# Patient Record
Sex: Male | Born: 1995 | Race: Asian | Hispanic: No | Marital: Single | Smoking: Never smoker
Health system: Southern US, Community
[De-identification: ages and names within clinical notes are randomized; demographics above are authoritative.]

## PROBLEM LIST (undated history)

## (undated) HISTORY — PX: SMALL INTESTINE SURGERY: SHX150

## (undated) HISTORY — PX: APPENDECTOMY: SHX54

---

## 2016-03-26 ENCOUNTER — Emergency Department (HOSPITAL_COMMUNITY): Payer: No Typology Code available for payment source

## 2016-03-26 ENCOUNTER — Encounter (HOSPITAL_COMMUNITY): Payer: Self-pay

## 2016-03-26 ENCOUNTER — Emergency Department (HOSPITAL_COMMUNITY)
Admission: EM | Admit: 2016-03-26 | Discharge: 2016-03-26 | Disposition: A | Discharge status: Home / Self Care | Payer: No Typology Code available for payment source | Attending: Emergency Medicine | Admitting: Emergency Medicine

## 2016-03-26 DIAGNOSIS — S93501A Unspecified sprain of right great toe, initial encounter: Secondary | ICD-10-CM | POA: Diagnosis not present

## 2016-03-26 DIAGNOSIS — Y939 Activity, unspecified: Secondary | ICD-10-CM | POA: Diagnosis not present

## 2016-03-26 DIAGNOSIS — Y92410 Unspecified street and highway as the place of occurrence of the external cause: Secondary | ICD-10-CM | POA: Insufficient documentation

## 2016-03-26 DIAGNOSIS — S99921A Unspecified injury of right foot, initial encounter: Secondary | ICD-10-CM | POA: Diagnosis present

## 2016-03-26 DIAGNOSIS — R109 Unspecified abdominal pain: Secondary | ICD-10-CM | POA: Insufficient documentation

## 2016-03-26 DIAGNOSIS — Y999 Unspecified external cause status: Secondary | ICD-10-CM | POA: Diagnosis not present

## 2016-03-26 DIAGNOSIS — S20212A Contusion of left front wall of thorax, initial encounter: Secondary | ICD-10-CM | POA: Diagnosis not present

## 2016-03-26 DIAGNOSIS — R079 Chest pain, unspecified: Secondary | ICD-10-CM | POA: Insufficient documentation

## 2016-03-26 DIAGNOSIS — S93509A Unspecified sprain of unspecified toe(s), initial encounter: Secondary | ICD-10-CM

## 2016-03-26 LAB — I-STAT CHEM 8, ED
BUN: 21 mg/dL — ABNORMAL HIGH (ref 6–20)
Calcium, Ion: 1.26 mmol/L (ref 1.15–1.40)
Chloride: 103 mmol/L (ref 101–111)
Creatinine, Ser: 0.8 mg/dL (ref 0.61–1.24)
Glucose, Bld: 102 mg/dL — ABNORMAL HIGH (ref 65–99)
HEMATOCRIT: 45 % (ref 39.0–52.0)
Hemoglobin: 15.3 g/dL (ref 13.0–17.0)
POTASSIUM: 3.6 mmol/L (ref 3.5–5.1)
Sodium: 141 mmol/L (ref 135–145)
TCO2: 25 mmol/L (ref 0–100)

## 2016-03-26 LAB — CBC WITH DIFFERENTIAL/PLATELET
Basophils Absolute: 0 10*3/uL (ref 0.0–0.1)
Basophils Relative: 0 %
Eosinophils Absolute: 0.1 10*3/uL (ref 0.0–0.7)
Eosinophils Relative: 1 %
HCT: 45 % (ref 39.0–52.0)
HEMOGLOBIN: 15.7 g/dL (ref 13.0–17.0)
LYMPHS ABS: 2.2 10*3/uL (ref 0.7–4.0)
LYMPHS PCT: 22 %
MCH: 30.5 pg (ref 26.0–34.0)
MCHC: 34.9 g/dL (ref 30.0–36.0)
MCV: 87.5 fL (ref 78.0–100.0)
Monocytes Absolute: 0.4 10*3/uL (ref 0.1–1.0)
Monocytes Relative: 4 %
NEUTROS PCT: 73 %
Neutro Abs: 7.3 10*3/uL (ref 1.7–7.7)
PLATELETS: 244 10*3/uL (ref 150–400)
RBC: 5.14 MIL/uL (ref 4.22–5.81)
RDW: 12.1 % (ref 11.5–15.5)
WBC: 9.9 10*3/uL (ref 4.0–10.5)

## 2016-03-26 MED ORDER — HYDROCODONE-ACETAMINOPHEN 5-325 MG PO TABS: 1.0000 | ORAL_TABLET | ORAL | 0 refills | Status: AC | PRN

## 2016-03-26 MED ORDER — IOPAMIDOL (ISOVUE-300) INJECTION 61%
INTRAVENOUS | Status: AC
  Administered 2016-03-26: 100 mL
  Filled 2016-03-26: qty 100

## 2016-03-26 MED ORDER — ONDANSETRON HCL 4 MG/2ML IJ SOLN
4.0000 mg | Freq: Once | INTRAMUSCULAR | Status: AC
  Administered 2016-03-26: 4 mg via INTRAVENOUS
  Filled 2016-03-26: qty 2

## 2016-03-26 MED ORDER — MORPHINE SULFATE (PF) 4 MG/ML IV SOLN
4.0000 mg | Freq: Once | INTRAVENOUS | Status: AC
  Administered 2016-03-26: 4 mg via INTRAVENOUS
  Filled 2016-03-26: qty 1

## 2016-03-26 MED ORDER — NAPROXEN 375 MG PO TABS: 375.0000 mg | ORAL_TABLET | Freq: Two times a day (BID) | ORAL | 0 refills | Status: AC | PRN

## 2016-03-26 NOTE — ED Notes (Signed)
EDP at bedside with ultrasound 

## 2016-03-26 NOTE — ED Notes (Signed)
Pt. Transported to CT at this time.  
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
